# Patient Record
Sex: Female | Born: 1975 | Race: White | Hispanic: No | Marital: Married | Smoking: Former smoker
Health system: Southern US, Community
[De-identification: ages and names within clinical notes are randomized; demographics above are authoritative.]

## PROBLEM LIST (undated history)

## (undated) DIAGNOSIS — Z8619 Personal history of other infectious and parasitic diseases: Secondary | ICD-10-CM

## (undated) DIAGNOSIS — T7840XA Allergy, unspecified, initial encounter: Secondary | ICD-10-CM

## (undated) HISTORY — DX: Allergy, unspecified, initial encounter: T78.40XA

## (undated) HISTORY — PX: KNEE SURGERY: SHX244

## (undated) HISTORY — DX: Personal history of other infectious and parasitic diseases: Z86.19

---

## 2007-03-16 ENCOUNTER — Observation Stay: Payer: Self-pay | Admitting: Obstetrics and Gynecology

## 2007-03-18 ENCOUNTER — Inpatient Hospital Stay: Payer: Self-pay | Admitting: Obstetrics and Gynecology

## 2008-12-14 ENCOUNTER — Ambulatory Visit: Payer: Self-pay | Admitting: Orthopedic Surgery

## 2008-12-15 ENCOUNTER — Ambulatory Visit: Payer: Self-pay | Admitting: Orthopedic Surgery

## 2009-03-01 ENCOUNTER — Emergency Department: Payer: Self-pay | Admitting: Internal Medicine

## 2009-03-03 ENCOUNTER — Ambulatory Visit: Payer: Self-pay | Admitting: Unknown Physician Specialty

## 2009-10-26 DIAGNOSIS — J309 Allergic rhinitis, unspecified: Secondary | ICD-10-CM | POA: Insufficient documentation

## 2012-06-05 ENCOUNTER — Ambulatory Visit: Payer: Self-pay | Admitting: Obstetrics and Gynecology

## 2013-06-01 LAB — LIPID PANEL
CHOLESTEROL: 137 mg/dL (ref 0–200)
HDL: 72 mg/dL — AB (ref 35–70)
LDL CALC: 59 mg/dL
TRIGLYCERIDES: 29 mg/dL — AB (ref 40–160)

## 2013-06-01 LAB — BASIC METABOLIC PANEL
BUN: 17 mg/dL (ref 4–21)
Creatinine: 0.8 mg/dL (ref 0.5–1.1)
Glucose: 83 mg/dL
Potassium: 4.2 mmol/L (ref 3.4–5.3)
Sodium: 142 mmol/L (ref 137–147)

## 2013-06-01 LAB — HEPATIC FUNCTION PANEL
ALT: 7 U/L (ref 7–35)
AST: 18 U/L (ref 13–35)

## 2013-06-01 LAB — CBC AND DIFFERENTIAL
HEMATOCRIT: 44 % (ref 36–46)
Hemoglobin: 14.5 g/dL (ref 12.0–16.0)
PLATELETS: 221 10*3/uL (ref 150–399)
WBC: 4.6 10^3/mL

## 2013-06-01 LAB — TSH: TSH: 1.92 u[IU]/mL (ref 0.41–5.90)

## 2013-09-24 LAB — HM MAMMOGRAPHY

## 2014-05-25 LAB — HM PAP SMEAR: HM PAP: NEGATIVE

## 2015-11-18 ENCOUNTER — Ambulatory Visit (INDEPENDENT_AMBULATORY_CARE_PROVIDER_SITE_OTHER): Payer: PRIVATE HEALTH INSURANCE | Admitting: Obstetrics and Gynecology

## 2015-11-18 ENCOUNTER — Encounter: Payer: Self-pay | Admitting: Obstetrics and Gynecology

## 2015-11-18 ENCOUNTER — Other Ambulatory Visit: Payer: Self-pay | Admitting: Obstetrics and Gynecology

## 2015-11-18 VITALS — BP 111/73 | HR 61 | Ht 68.0 in | Wt 150.1 lb

## 2015-11-18 DIAGNOSIS — N921 Excessive and frequent menstruation with irregular cycle: Secondary | ICD-10-CM | POA: Diagnosis not present

## 2015-11-18 DIAGNOSIS — Z01419 Encounter for gynecological examination (general) (routine) without abnormal findings: Secondary | ICD-10-CM | POA: Diagnosis not present

## 2015-11-18 DIAGNOSIS — Z975 Presence of (intrauterine) contraceptive device: Secondary | ICD-10-CM

## 2015-11-18 NOTE — Patient Instructions (Signed)
  Place annual gynecologic exam patient instructions here.  Thank you for enrolling in MyChart. Please follow the instructions below to securely access your online medical record. MyChart allows you to send messages to your doctor, view your test results, manage appointments, and more.   How Do I Sign Up? 1. In your Internet browser, go to [employer] and enter [email or web address]. 2. Click on the Sign Up Now link in the Sign In box. You will see the New Member Sign Up page. 3. Enter your MyChart Access Code exactly as it appears below. You will not need to use this code after you've completed the sign-up process. If you do not sign up before the expiration date, you must request a new code.  MyChart Access Code: [email or web address] Expires: 12/10/2015  3:25 PM  4. Enter your Social Security Number ([id number]) and Date of Birth (mm/dd/yyyy) as indicated and click Submit. You will be taken to the next sign-up page. 5. Create a MyChart ID. This will be your MyChart login ID and cannot be changed, so think of one that is secure and easy to remember. 6. Create a MyChart password. You can change your password at any time. 7. Enter your Password Reset Question and Answer. This can be used at a later time if you forget your password.  8. Enter your e-mail address. You will receive e-mail notification when new information is available in MyChart. 9. Click Sign Up. You can now view your medical record.   Additional Information Remember, MyChart is NOT to be used for urgent needs. For medical emergencies, dial 911.

## 2015-11-18 NOTE — Progress Notes (Signed)
Subjective:   Victoria Leblanc is a 40 y.o. G81P2002 Caucasian female here for a routine well-woman exam.  No LMP recorded (lmp unknown). Patient is not currently having periods (Reason: IUD).    Current complaints: painful cramping with irregular bleeding since Mirena replaced 3 years ago, reports it was a difficult placement with a lot of pain and bleeding, and hasn't felt right since. Denies pain with sex or postcoital bleeding. 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  Past Medical History History reviewed. No pertinent past medical history.  Past Surgical History Past Surgical History  Procedure Laterality Date  . Knee surgery      x 6    Gynecologic History G2P2002  No LMP recorded (lmp unknown). Patient is not currently having periods (Reason: IUD). Contraception: IUD Last Pap: 2015. Results were: normal   Obstetric History OB History  Gravida Para Term Preterm AB SAB TAB Ectopic Multiple Living  # Outcome Date GA Lbr Len/2nd Weight Sex Delivery Anes PTL Lv  2 Term 2008   7 lb 1.9 oz (3.23 kg) F Vag-Spont   Y  1 Term 2005   7 lb 2.4 oz (3.243 kg) M Vag-Spont   Y      Current Medications No current outpatient prescriptions on file prior to visit.   No current facility-administered medications on file prior to visit.    Review of Systems Patient denies any headaches, blurred vision, shortness of breath, chest pain, abdominal pain, problems with bowel movements, urination, or intercourse.  Objective:  BP 111/73 mmHg  Pulse  61  Ht  (1.727 m)  Wt 150 lb 1.6 oz (68.085 kg)  BMI 22.83 kg/m2  LMP  (LMP Unknown) Physical Exam  General:  Well developed, well nourished, no acute distress. She is alert and oriented x3. Skin:  Warm and dry Neck:  Midline trachea, no thyromegaly or nodules Cardiovascular: Regular rate and rhythm, no murmur heard Lungs:  Effort normal, all lung fields clear to auscultation bilaterally Breasts:  No dominant palpable mass, retraction, or nipple discharge Abdomen:  Soft, non tender, no hepatosplenomegaly or masses Pelvic:  External genitalia is normal in appearance.  The vagina is normal in appearance. The cervix is bulbous, no CMT.  Thin prep pap is done with HR HPV cotesting. Uterus is felt to be normal size, shape, and contour.  No adnexal masses or tenderness noted. IUD string noted Extremities:  No swelling or varicosities noted Psych:  She has a normal mood and affect  Assessment:   Healthy well-woman exam BTB with IUD  pelvic pain  Plan:  Routine screening labs obtained Will return in 1-2 weeks for pelvic u/s and follow up accordingly F/U 1 year for AE, or sooner if needed   Melody Suzan Nailer, CNM

## 2015-11-19 LAB — LIPID PANEL
Chol/HDL Ratio: 2.1 ratio units (ref 0.0–4.4)
Cholesterol, Total: 136 mg/dL (ref 100–199)
HDL: 65 mg/dL (ref >39)
LDL Calculated: 63 mg/dL (ref 0–99)
Triglycerides: 40 mg/dL (ref 0–149)
VLDL Cholesterol Cal: 8 mg/dL (ref 5–40)

## 2015-11-19 LAB — COMPREHENSIVE METABOLIC PANEL
ALBUMIN: 4.6 g/dL (ref 3.5–5.5)
ALT: 5 IU/L (ref 0–32)
AST: 14 IU/L (ref 0–40)
Albumin/Globulin Ratio: 2.3 (ref 1.1–2.5)
Alkaline Phosphatase: 30 IU/L — ABNORMAL LOW (ref 39–117)
BILIRUBIN TOTAL: 0.4 mg/dL (ref 0.0–1.2)
BUN / CREAT RATIO: 18 (ref 8–20)
BUN: 14 mg/dL (ref 6–20)
CO2: 22 mmol/L (ref 18–29)
CREATININE: 0.76 mg/dL (ref 0.57–1.00)
Calcium: 9.3 mg/dL (ref 8.7–10.2)
Chloride: 103 mmol/L (ref 96–106)
GFR calc non Af Amer: 99 mL/min/{1.73_m2} (ref >59)
GFR, EST AFRICAN AMERICAN: 114 mL/min/{1.73_m2} (ref >59)
GLUCOSE: 86 mg/dL (ref 65–99)
Globulin, Total: 2 g/dL (ref 1.5–4.5)
Potassium: 3.6 mmol/L (ref 3.5–5.2)
Sodium: 141 mmol/L (ref 134–144)
TOTAL PROTEIN: 6.6 g/dL (ref 6.0–8.5)

## 2015-11-21 LAB — CYTOLOGY - PAP

## 2015-11-22 ENCOUNTER — Telehealth: Payer: Self-pay | Admitting: *Deleted

## 2015-11-22 NOTE — Telephone Encounter (Signed)
Mailed all info to pt 

## 2015-11-22 NOTE — Telephone Encounter (Signed)
-----   Message from Purcell Nails, [place] sent at 11/22/2015  2:29 PM EST ----- Please let her know pap was ASCUS, but negative HPV, so we will just repeat in 1 year. Please mail info on her pap

## 2015-12-02 ENCOUNTER — Other Ambulatory Visit: Payer: Self-pay | Admitting: Obstetrics and Gynecology

## 2015-12-02 DIAGNOSIS — R102 Pelvic and perineal pain: Secondary | ICD-10-CM

## 2015-12-09 ENCOUNTER — Ambulatory Visit: Payer: PRIVATE HEALTH INSURANCE | Admitting: Obstetrics and Gynecology

## 2015-12-09 ENCOUNTER — Encounter: Payer: Self-pay | Admitting: Obstetrics and Gynecology

## 2015-12-09 ENCOUNTER — Ambulatory Visit (INDEPENDENT_AMBULATORY_CARE_PROVIDER_SITE_OTHER): Payer: PRIVATE HEALTH INSURANCE

## 2015-12-09 ENCOUNTER — Ambulatory Visit (INDEPENDENT_AMBULATORY_CARE_PROVIDER_SITE_OTHER): Payer: PRIVATE HEALTH INSURANCE | Admitting: Obstetrics and Gynecology

## 2015-12-09 VITALS — BP 117/66 | HR 63 | Wt 151.7 lb

## 2015-12-09 DIAGNOSIS — R102 Pelvic and perineal pain unspecified side: Secondary | ICD-10-CM

## 2015-12-09 DIAGNOSIS — Z30432 Encounter for removal of intrauterine contraceptive device: Secondary | ICD-10-CM

## 2015-12-09 MED ORDER — NORETHIN-ETH ESTRAD-FE BIPHAS 1 MG-10 MCG / 10 MCG PO TABS: 1.0000 | ORAL_TABLET | Freq: Every day | ORAL | Status: DC

## 2015-12-09 NOTE — Progress Notes (Signed)
Patient ID: Victoria RanksLynette Leblanc, female   DOB: 02/17/1976, 40 y.o.   MRN: [id number] S: still having intermittent pelvic pain with IUD- here to review u/s and desires removal of IUD. Spouse is planning on getting vasectomy.  O: A&O x4 Well groomed female Blood pressure 117/66, pulse 63, weight 151 lb 11.2 oz (68.811 kg).   U/S Findings:  The uterus measures 9.3 x 4.5 x 5.8 cm. An IUD is seen and on 3D imaging, appears to be in the proper place within the uterus.  Echo texture is homogenous without evidence of focal masses.  The Endometrium measures 3.4 mm.  Right Ovary measures 3.0 x 1.7 x 1.9 cm. There is a dominant 1.3 cm follicle seen, otherwise, the ovary appears WNL. Left Ovary measures 3.6 x 2.1 x 2.1 cm. There is a dominant 1.7 cm follicle seen, otherwise, the ovary appears WNL. Survey of the adnexa demonstrates no adnexal masses. There is no free fluid in the cul de sac.  Impression: 1. Normal appearing uterus with IUD which appears within the proper place within. 2. Dominant bilateral ovarian follicles.     IUD removed without difficulty, and 2 samples of LoLoEstrin given- to start today RTC prn.  Melody [place]townShambley, CNM

## 2016-01-31 ENCOUNTER — Telehealth: Payer: Self-pay | Admitting: *Deleted

## 2016-01-31 ENCOUNTER — Other Ambulatory Visit: Payer: Self-pay | Admitting: *Deleted

## 2016-01-31 MED ORDER — NORETHIN-ETH ESTRAD-FE BIPHAS 1 MG-10 MCG / 10 MCG PO TABS: 1.0000 | ORAL_TABLET | Freq: Every day | ORAL | Status: DC

## 2016-01-31 NOTE — Telephone Encounter (Signed)
Sent in medication

## 2016-01-31 NOTE — Telephone Encounter (Signed)
Patient states she needs a refill of her birth control Loestrin. [facility] [address]. Thanks

## 2016-03-07 ENCOUNTER — Ambulatory Visit (INDEPENDENT_AMBULATORY_CARE_PROVIDER_SITE_OTHER): Payer: PRIVATE HEALTH INSURANCE | Admitting: Family Medicine

## 2016-03-07 ENCOUNTER — Encounter: Payer: Self-pay | Admitting: Family Medicine

## 2016-03-07 VITALS — BP 110/60 | HR 67 | Temp 98.3°F | Resp 16 | Wt 154.0 lb

## 2016-03-07 DIAGNOSIS — H538 Other visual disturbances: Secondary | ICD-10-CM | POA: Diagnosis not present

## 2016-03-07 DIAGNOSIS — R51 Headache: Secondary | ICD-10-CM | POA: Diagnosis not present

## 2016-03-07 DIAGNOSIS — I499 Cardiac arrhythmia, unspecified: Secondary | ICD-10-CM | POA: Insufficient documentation

## 2016-03-07 DIAGNOSIS — T753XXA Motion sickness, initial encounter: Secondary | ICD-10-CM | POA: Insufficient documentation

## 2016-03-07 DIAGNOSIS — R519 Headache, unspecified: Secondary | ICD-10-CM

## 2016-03-07 NOTE — Progress Notes (Signed)
       Patient: Victoria Leblanc Female    DOB: 01/18/1976   40 y.o.   MRN: [id number] Visit Date: 03/07/2016  Today's Provider: Mila Merryonald Fisher, MD   Chief Complaint  Patient presents with  . Eye Problem   Subjective:    HPI  Patient has had 2 episodes in the last month that her vision suddenly became blurry, starting with peripheral vision and working in word. Blurry vision will last up to 15 minutes then return to normal.  Afterwords she will develop a "very dull" headache across both temples that may last several hours. She feels a little nauseated when he vision is blurred, but no vomiting and no other neurological symptoms. The second episode was early this morning and has completely resolved. Otherwise she feels well. She admits to feeling tired most of the time which she attributes to her very busy schedule working, caring for two children with her husband working out of town. There has been no recent change in energy level. She is not taking any OTC medications besides Advil today. She denies history of migraines, but states she did have two severe headaches years ago causing nausea and vomiting but no other neurological symptoms.   Allergies  Allergen Reactions  . Morphine     swelling, hives.  . Pollen Extract     itchy eyes.   Current Meds  Medication Sig  . Norethindrone-Ethinyl Estradiol-Fe Biphas (LO LOESTRIN FE) 1 MG-10 MCG / 10 MCG tablet Take 1 tablet by mouth daily.    Review of Systems  Constitutional: Negative for fever, chills, appetite change and fatigue.  Eyes: Positive for photophobia and visual disturbance.  Respiratory: Negative for chest tightness and shortness of breath.   Cardiovascular: Negative for chest pain and palpitations.  Gastrointestinal: Negative for nausea, vomiting and abdominal pain.  Neurological: Positive for headaches. Negative for dizziness and weakness.    Social History  Substance Use Topics  . Smoking status: Former Games developermoker  .  Smokeless tobacco: Not on file  . Alcohol Use: 0.0 oz/week    0 Standard drinks or equivalent per week     Comment: occas   Objective:   BP 110/60 mmHg  Pulse 67  Temp(Src) 98.3 F (36.8 C) (Oral)  Resp 16  Wt 154 lb (69.854 kg)  SpO2 98%  Physical Exam   General Appearance:    Alert, cooperative, no distress  HEENT:    NP/OP pink and clear without lesions.   Eyes:    PERRL, conjunctiva/corneas clear, EOM's intact       Lungs:     Clear to auscultation bilaterally, respirations unlabored  Heart:    Regular rate and rhythm. No MRG  Neurologic:   Awake, alert, oriented x 3. No apparent focal neurological           defect. Normal s/s. Normal finger to nose. Negative rhomberg.           Assessment & Plan:     1. Blurry vision, bilateral  - Ambulatory referral to Ophthalmology  2. Nonintractable episodic headache, unspecified headache type Ddx includes migraine, intraocular disease, and intracranial disease. She has never had neuroimaging. If ophthalmology evaluation is negative will consider neuroimaging.        Mila Merryonald Fisher, MD  [facility]

## 2016-05-24 ENCOUNTER — Encounter: Payer: Self-pay | Admitting: Obstetrics and Gynecology

## 2016-05-24 ENCOUNTER — Ambulatory Visit (INDEPENDENT_AMBULATORY_CARE_PROVIDER_SITE_OTHER): Payer: PRIVATE HEALTH INSURANCE | Admitting: Obstetrics and Gynecology

## 2016-05-24 VITALS — BP 135/78 | HR 73 | Ht 68.0 in | Wt 154.5 lb

## 2016-05-24 DIAGNOSIS — Z30430 Encounter for insertion of intrauterine contraceptive device: Secondary | ICD-10-CM | POA: Diagnosis not present

## 2016-05-24 NOTE — Progress Notes (Signed)
Patrici RanksLynette Leblanc is a 40 y.o. year old 582P2002 Caucasian female who presents for placement of a Mirena IUD.  Patient's last menstrual period was 04/15/2016. BP 135/78   Pulse 73   Ht 5\' 8"  (1.727 m)   Wt 154 lb 8 oz (70.1 kg)   LMP 04/15/2016   BMI 23.49 kg/m  Last sexual intercourse was >3 weeks ago and is on OCPs, and pregnancy test today was negative  The risks and benefits of the method and placement have been thouroughly reviewed with the patient and all questions were answered.  Specifically the patient is aware of failure rate of 09/998, expulsion of the IUD and of possible perforation.  The patient is aware of irregular bleeding due to the method and understands the incidence of irregular bleeding diminishes with time.  Signed copy of informed consent in chart.   Time out was performed.  A graves speculum was placed in the vagina.  The cervix was visualized, prepped using Betadine, and grasped with a single tooth tenaculum. The uterus was found to be retroflexed and it sounded to 9 cm.  Mirena IUD placed per manufacturer's recommendations.   The strings were trimmed to 3 cm.  The patient was given post procedure instructions, including signs and symptoms of infection and to check for the strings after each menses or each month, and refraining from intercourse or anything in the vagina for 3 days.  She was given a Mirena care card with date Mirena placed, and date Mirena to be removed.    Victoria Leblanc Suzan NailerN Jaden Batchelder, CNM

## 2016-05-24 NOTE — Patient Instructions (Signed)
IUD PLACEMENT POST-PROCEDURE INSTRUCTIONS  1. You may take Ibuprofen, Aleve or Tylenol for pain if needed.  Cramping should resolve within in 24 hours.  2. You may have a small amount of spotting.  You should wear a mini pad for the next few days.  3. You may have intercourse after 24 hours.  If you using this for birth control, it is effective immediately.  4. You need to call if you have any pelvic pain, fever, heavy bleeding or foul smelling vaginal discharge.  Irregular bleeding is common the first several months after having an IUD placed. You do not need to call for this reason unless you are concerned.  5. Shower or bathe as normal   

## 2017-01-08 ENCOUNTER — Other Ambulatory Visit: Payer: Self-pay | Admitting: Sports Medicine

## 2017-01-08 DIAGNOSIS — M25561 Pain in right knee: Secondary | ICD-10-CM

## 2017-01-17 ENCOUNTER — Ambulatory Visit
Admission: RE | Admit: 2017-01-17 | Discharge: 2017-01-17 | Disposition: A | Discharge status: Home / Self Care | Payer: PRIVATE HEALTH INSURANCE | Source: Ambulatory Visit | Attending: Sports Medicine | Admitting: Sports Medicine

## 2017-01-17 ENCOUNTER — Encounter: Payer: Self-pay | Admitting: Radiology

## 2017-01-17 DIAGNOSIS — S83501A Sprain of unspecified cruciate ligament of right knee, initial encounter: Secondary | ICD-10-CM | POA: Insufficient documentation

## 2017-01-17 DIAGNOSIS — M1711 Unilateral primary osteoarthritis, right knee: Secondary | ICD-10-CM | POA: Diagnosis not present

## 2017-01-17 DIAGNOSIS — X58XXXA Exposure to other specified factors, initial encounter: Secondary | ICD-10-CM | POA: Insufficient documentation

## 2017-01-17 DIAGNOSIS — M25461 Effusion, right knee: Secondary | ICD-10-CM | POA: Diagnosis not present

## 2017-01-17 DIAGNOSIS — M25561 Pain in right knee: Secondary | ICD-10-CM | POA: Diagnosis not present

## 2017-12-17 ENCOUNTER — Other Ambulatory Visit: Payer: Self-pay | Admitting: *Deleted

## 2017-12-17 ENCOUNTER — Telehealth: Payer: Self-pay | Admitting: Obstetrics and Gynecology

## 2017-12-17 MED ORDER — SCOPOLAMINE 1 MG/3DAYS TD PT72: 1.0000 | MEDICATED_PATCH | TRANSDERMAL | 1 refills | Status: AC

## 2017-12-17 NOTE — Telephone Encounter (Signed)
[place] and [place] motion sickness. She wanted to know if she could get something for that called in or if she needs to come in for an appointment she can do that. Thanks

## 2017-12-17 NOTE — Telephone Encounter (Signed)
Done-ac 

## 2018-03-21 ENCOUNTER — Encounter: Payer: PRIVATE HEALTH INSURANCE | Admitting: Obstetrics and Gynecology

## 2018-04-10 ENCOUNTER — Ambulatory Visit (INDEPENDENT_AMBULATORY_CARE_PROVIDER_SITE_OTHER): Payer: PRIVATE HEALTH INSURANCE | Admitting: Obstetrics and Gynecology

## 2018-04-10 ENCOUNTER — Other Ambulatory Visit: Payer: Self-pay | Admitting: Obstetrics and Gynecology

## 2018-04-10 ENCOUNTER — Encounter: Payer: Self-pay | Admitting: Obstetrics and Gynecology

## 2018-04-10 VITALS — BP 118/62 | HR 88 | Ht 68.0 in | Wt 137.0 lb

## 2018-04-10 DIAGNOSIS — Z01419 Encounter for gynecological examination (general) (routine) without abnormal findings: Secondary | ICD-10-CM

## 2018-04-10 DIAGNOSIS — Z30431 Encounter for routine checking of intrauterine contraceptive device: Secondary | ICD-10-CM | POA: Diagnosis not present

## 2018-04-10 NOTE — Progress Notes (Signed)
Subjective:   Victoria Leblanc is a 42 y.o. G40P2002 Caucasian female here for a routine well-woman exam.  Patient's last menstrual period was 03/21/2018.    Current complaints: Pt present with no gynecological or medical complaints today. 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  Past Medical History Past Medical History:  Diagnosis Date  . Allergy   . History of chicken pox     Past Surgical History Past Surgical History:  Procedure Laterality Date  . KNEE SURGERY Bilateral    x 6; Dr. Rosita Kea    Gynecologic History [id number]  Patient's last menstrual period was 03/21/2018. Contraception: IUD Last Pap: 11/18/2015. Results were: normal Last mammogram: 11/18/2015. Results were: normal   Obstetric History OB History  Gravida Para Term Preterm AB Living  2 2 2     2   SAB TAB Ectopic Multiple Live Births          2    # Outcome Date GA Lbr Len/2nd Weight Sex Delivery Anes PTL Lv  2 Term 2008   7 lb 1.9 oz (3.23 kg) F Vag-Spont   LIV  1 Term 2005   7 lb 2.4 oz (3.243 kg) M Vag-Spont   LIV    Current Medications Current Outpatient Medications on File Prior to Visit  Medication Sig Dispense Refill  . levonorgestrel (MIRENA) 20 MCG/24HR IUD 1 each by Intrauterine route once.    . Norethindrone-Ethinyl Estradiol-Fe Biphas (LO LOESTRIN FE) 1 MG-10 MCG / 10 MCG tablet Take 1 tablet by mouth daily. (Patient not taking: Reported on 04/10/2018) 3 Package 3  . scopolamine (TRANSDERM-SCOP, 1.5  MG,) 1 MG/3DAYS Place 1 patch (1.5 mg total) onto the skin every 3 (three) days. (Patient not taking: Reported on 04/10/2018) 10 patch 1   No current facility-administered medications on file prior to visit.     Review of Systems Patient denies any headaches, blurred vision, shortness of breath, chest pain, abdominal pain, problems with bowel movements, urination, or intercourse.  Objective:  BP 118/62   Pulse 88   Ht 5\' 8"  (1.727 m)   Wt 137 lb (62.1 kg)   LMP 03/21/2018   BMI 20.83 kg/m  Physical Exam  General:  Well developed, well nourished, no acute distress. She is alert and oriented x3. Skin:  Warm and dry Neck:  Midline trachea, no thyromegaly or nodules Cardiovascular: Regular rate and rhythm, no murmur heard Lungs:  Effort normal, all lung fields clear to auscultation bilaterally Breasts:  No dominant palpable mass, retraction, or nipple discharge Abdomen:  Soft, non tender, no hepatosplenomegaly or masses Pelvic:  External genitalia is normal in appearance.  The vagina is normal in appearance. The cervix is bulbous, no CMT.  Thin prep pap is done with HR HPV cotesting. IUD string visualized. Uterus is felt to be normal size, shape, and contour.  No adnexal masses or tenderness noted. Extremities:  No swelling or varicosities noted Psych:  She has a normal mood and affect  Assessment:   Healthy well-woman exam IUD  Plan:   F/U 1 year for annual exam, or  sooner if needed Mammogram to be scheduled Labs to be drawn today and follow up as necessary   Melody Suzan NailerN Shambley, CNM

## 2018-04-10 NOTE — Patient Instructions (Signed)
Preventive Care 18-39 Years, Female Preventive care refers to lifestyle choices and visits with your health care provider that can promote health and wellness. What does preventive care include?  A yearly physical exam. This is also called an annual well check.  Dental exams once or twice a year.  Routine eye exams. Ask your health care provider how often you should have your eyes checked.  Personal lifestyle choices, including: ? Daily care of your teeth and gums. ? Regular physical activity. ? Eating a healthy diet. ? Avoiding tobacco and drug use. ? Limiting alcohol use. ? Practicing safe sex. ? Taking vitamin and mineral supplements as recommended by your health care provider. What happens during an annual well check? The services and screenings done by your health care provider during your annual well check will depend on your age, overall health, lifestyle risk factors, and family history of disease. Counseling Your health care provider may ask you questions about your:  Alcohol use.  Tobacco use.  Drug use.  Emotional well-being.  Home and relationship well-being.  Sexual activity.  Eating habits.  Work and work Statistician.  Method of birth control.  Menstrual cycle.  Pregnancy history.  Screening You may have the following tests or measurements:  Height, weight, and BMI.  Diabetes screening. This is done by checking your blood sugar (glucose) after you have not eaten for a while (fasting).  Blood pressure.  Lipid and cholesterol levels. These may be checked every 5 years starting at age 38.  Skin check.  Hepatitis C blood test.  Hepatitis B blood test.  Sexually transmitted disease (STD) testing.  BRCA-related cancer screening. This may be done if you have a family history of breast, ovarian, tubal, or peritoneal cancers.  Pelvic exam and Pap test. This may be done every 3 years starting at age 38. Starting at age 30, this may be done  every 5 years if you have a Pap test in combination with an HPV test.  Discuss your test results, treatment options, and if necessary, the need for more tests with your health care provider. Vaccines Your health care provider may recommend certain vaccines, such as:  Influenza vaccine. This is recommended every year.  Tetanus, diphtheria, and acellular pertussis (Tdap, Td) vaccine. You may need a Td booster every 10 years.  Varicella vaccine. You may need this if you have not been vaccinated.  HPV vaccine. If you are 39 or younger, you may need three doses over 6 months.  Measles, mumps, and rubella (MMR) vaccine. You may need at least one dose of MMR. You may also need a second dose.  Pneumococcal 13-valent conjugate (PCV13) vaccine. You may need this if you have certain conditions and were not previously vaccinated.  Pneumococcal polysaccharide (PPSV23) vaccine. You may need one or two doses if you smoke cigarettes or if you have certain conditions.  Meningococcal vaccine. One dose is recommended if you are age 68-21 years and a first-year college student living in a residence hall, or if you have one of several medical conditions. You may also need additional booster doses.  Hepatitis A vaccine. You may need this if you have certain conditions or if you travel or work in places where you may be exposed to hepatitis A.  Hepatitis B vaccine. You may need this if you have certain conditions or if you travel or work in places where you may be exposed to hepatitis B.  Haemophilus influenzae type b (Hib) vaccine. You may need this  if you have certain risk factors.  Talk to your health care provider about which screenings and vaccines you need and how often you need them. This information is not intended to replace advice given to you by your health care provider. Make sure you discuss any questions you have with your health care provider. Document Released: 11/06/2001 Document Revised:  05/30/2016 Document Reviewed: 07/12/2015 Elsevier Interactive Patient Education  2018 [employer]vier Inc.  

## 2018-04-11 LAB — COMPREHENSIVE METABOLIC PANEL
ALK PHOS: 28 IU/L — AB (ref 39–117)
ALT: 8 IU/L (ref 0–32)
AST: 15 IU/L (ref 0–40)
Albumin/Globulin Ratio: 1.9 (ref 1.2–2.2)
Albumin: 4.1 g/dL (ref 3.5–5.5)
BILIRUBIN TOTAL: 0.3 mg/dL (ref 0.0–1.2)
BUN / CREAT RATIO: 16 (ref 9–23)
BUN: 13 mg/dL (ref 6–24)
CHLORIDE: 103 mmol/L (ref 96–106)
CO2: 25 mmol/L (ref 20–29)
Calcium: 8.8 mg/dL (ref 8.7–10.2)
Creatinine, Ser: 0.8 mg/dL (ref 0.57–1.00)
GFR calc non Af Amer: 91 mL/min/{1.73_m2} (ref >59)
GFR, EST AFRICAN AMERICAN: 105 mL/min/{1.73_m2} (ref >59)
GLUCOSE: 83 mg/dL (ref 65–99)
Globulin, Total: 2.2 g/dL (ref 1.5–4.5)
Potassium: 4.3 mmol/L (ref 3.5–5.2)
Sodium: 141 mmol/L (ref 134–144)
TOTAL PROTEIN: 6.3 g/dL (ref 6.0–8.5)

## 2018-04-11 LAB — LIPID PANEL
Chol/HDL Ratio: 2 ratio (ref 0.0–4.4)
Cholesterol, Total: 142 mg/dL (ref 100–199)
HDL: 70 mg/dL (ref >39)
LDL Calculated: 65 mg/dL (ref 0–99)
Triglycerides: 33 mg/dL (ref 0–149)
VLDL CHOLESTEROL CAL: 7 mg/dL (ref 5–40)

## 2018-04-15 LAB — CYTOLOGY - PAP

## 2018-04-30 ENCOUNTER — Ambulatory Visit
Admission: RE | Admit: 2018-04-30 | Discharge: 2018-04-30 | Disposition: A | Discharge status: Home / Self Care | Payer: PRIVATE HEALTH INSURANCE | Source: Ambulatory Visit | Attending: Obstetrics and Gynecology | Admitting: Obstetrics and Gynecology

## 2018-04-30 DIAGNOSIS — Z01419 Encounter for gynecological examination (general) (routine) without abnormal findings: Secondary | ICD-10-CM | POA: Insufficient documentation

## 2018-05-01 ENCOUNTER — Other Ambulatory Visit: Payer: Self-pay | Admitting: Obstetrics and Gynecology

## 2018-05-01 DIAGNOSIS — R928 Other abnormal and inconclusive findings on diagnostic imaging of breast: Secondary | ICD-10-CM

## 2018-05-01 DIAGNOSIS — R921 Mammographic calcification found on diagnostic imaging of breast: Secondary | ICD-10-CM

## 2018-05-07 ENCOUNTER — Ambulatory Visit
Admission: RE | Admit: 2018-05-07 | Discharge: 2018-05-07 | Disposition: A | Discharge status: Home / Self Care | Payer: PRIVATE HEALTH INSURANCE | Source: Ambulatory Visit | Attending: Obstetrics and Gynecology | Admitting: Obstetrics and Gynecology

## 2018-05-07 DIAGNOSIS — R921 Mammographic calcification found on diagnostic imaging of breast: Secondary | ICD-10-CM

## 2018-05-07 DIAGNOSIS — R928 Other abnormal and inconclusive findings on diagnostic imaging of breast: Secondary | ICD-10-CM | POA: Diagnosis not present

## 2018-09-26 ENCOUNTER — Other Ambulatory Visit: Payer: Self-pay | Admitting: Obstetrics and Gynecology

## 2018-09-26 DIAGNOSIS — R921 Mammographic calcification found on diagnostic imaging of breast: Secondary | ICD-10-CM

## 2018-11-11 ENCOUNTER — Ambulatory Visit
Admission: RE | Admit: 2018-11-11 | Discharge: 2018-11-11 | Disposition: A | Discharge status: Home / Self Care | Payer: PRIVATE HEALTH INSURANCE | Source: Ambulatory Visit | Attending: Obstetrics and Gynecology | Admitting: Obstetrics and Gynecology

## 2018-11-11 DIAGNOSIS — R921 Mammographic calcification found on diagnostic imaging of breast: Secondary | ICD-10-CM

## 2018-11-12 ENCOUNTER — Other Ambulatory Visit: Payer: Self-pay | Admitting: Obstetrics and Gynecology

## 2018-11-12 DIAGNOSIS — Z1231 Encounter for screening mammogram for malignant neoplasm of breast: Secondary | ICD-10-CM

## 2018-11-12 DIAGNOSIS — R921 Mammographic calcification found on diagnostic imaging of breast: Secondary | ICD-10-CM

## 2019-05-04 ENCOUNTER — Ambulatory Visit
Admission: RE | Admit: 2019-05-04 | Discharge: 2019-05-04 | Disposition: A | Discharge status: Home / Self Care | Payer: PRIVATE HEALTH INSURANCE | Source: Ambulatory Visit | Attending: Obstetrics and Gynecology | Admitting: Obstetrics and Gynecology

## 2019-05-04 ENCOUNTER — Other Ambulatory Visit: Payer: Self-pay

## 2019-05-04 DIAGNOSIS — R921 Mammographic calcification found on diagnostic imaging of breast: Secondary | ICD-10-CM | POA: Insufficient documentation

## 2019-05-04 DIAGNOSIS — Z1231 Encounter for screening mammogram for malignant neoplasm of breast: Secondary | ICD-10-CM | POA: Insufficient documentation

## 2019-05-06 ENCOUNTER — Other Ambulatory Visit: Payer: PRIVATE HEALTH INSURANCE

## 2021-03-21 ENCOUNTER — Ambulatory Visit (INDEPENDENT_AMBULATORY_CARE_PROVIDER_SITE_OTHER): Payer: 59 | Admitting: Certified Nurse Midwife

## 2021-03-21 ENCOUNTER — Encounter: Payer: Self-pay | Admitting: Certified Nurse Midwife

## 2021-03-21 ENCOUNTER — Other Ambulatory Visit: Payer: Self-pay

## 2021-03-21 VITALS — BP 118/77 | HR 66 | Ht 68.0 in | Wt 140.2 lb

## 2021-03-21 DIAGNOSIS — Z1231 Encounter for screening mammogram for malignant neoplasm of breast: Secondary | ICD-10-CM

## 2021-03-21 DIAGNOSIS — Z1159 Encounter for screening for other viral diseases: Secondary | ICD-10-CM

## 2021-03-21 DIAGNOSIS — Z01419 Encounter for gynecological examination (general) (routine) without abnormal findings: Secondary | ICD-10-CM | POA: Diagnosis not present

## 2021-03-21 DIAGNOSIS — Z114 Encounter for screening for human immunodeficiency virus [HIV]: Secondary | ICD-10-CM

## 2021-03-21 DIAGNOSIS — Z23 Encounter for immunization: Secondary | ICD-10-CM

## 2021-03-21 MED ORDER — TETANUS-DIPHTH-ACELL PERTUSSIS 5-2.5-18.5 LF-MCG/0.5 IM SUSY
0.5000 mL | PREFILLED_SYRINGE | Freq: Once | INTRAMUSCULAR | Status: AC
  Administered 2021-03-21: 14:00:00 0.5 mL via INTRAMUSCULAR

## 2021-03-21 NOTE — Addendum Note (Signed)
Addended by: Mechele Claude on: 03/21/2021 02:08 PM   Modules accepted: Orders

## 2021-03-21 NOTE — Progress Notes (Addendum)
GYNECOLOGY ANNUAL PREVENTATIVE CARE ENCOUNTER NOTE  History:     Victoria Leblanc is a 45 y.o. G4P2002 female here for a routine annual gynecologic exam.  Current complaints: none.   Denies abnormal vaginal bleeding, discharge, pelvic pain, problems with intercourse or other gynecologic concerns.     Social Relationship: married  Living: spouse and children Work: [employer] school system  Exercise: walks daily  Smoke/Alcohol/drug use: rare alcohol, denies smoking and drug use  Gynecologic History No LMP recorded. (Menstrual status: IUD). Contraception: IUD Last Pap: 04/10/2018. Results were: normal with negative HPV Last mammogram: 05/04/19. Results were: No suspicious mass or distortion identified in either breast. Two coarse calcifications in the upper-outer quadrant of the left breast are stable compared to the prior exam dated 04/30/2018  The pregnancy intention screening data noted above was reviewed. Potential methods of contraception were discussed. The patient elected to proceed with IUD or IUS.    Obstetric History OB History  Gravida Para Term Preterm AB Living  2 2 2     2   SAB IAB Ectopic Multiple Live Births          2    # Outcome Date GA Lbr Len/2nd Weight Sex Delivery Anes PTL Lv  2 Term 2008   7 lb 1.9 oz (3.23 kg) F Vag-Spont   LIV  1 Term 2005   7 lb 2.4 oz (3.243 kg) M Vag-Spont   LIV    Past Medical History:  Diagnosis Date   Allergy    History of chicken pox     Past Surgical History:  Procedure Laterality Date   KNEE SURGERY Bilateral    x 6; Dr. 2009    Current Outpatient Medications on File Prior to Visit  Medication Sig Dispense Refill   levonorgestrel (MIRENA) 20 MCG/24HR IUD 1 each by Intrauterine route once.     Norethindrone-Ethinyl Estradiol-Fe Biphas (LO LOESTRIN FE) 1 MG-10 MCG / 10 MCG tablet Take 1 tablet by mouth daily. (Patient not taking: No sig reported) 3 Package 3   scopolamine (TRANSDERM-SCOP, 1.5 MG,) 1 MG/3DAYS Place 1  patch (1.5 mg total) onto the skin every 3 (three) days. (Patient not taking: No sig reported) 10 patch 1   No current facility-administered medications on file prior to visit.    Allergies  Allergen Reactions   Morphine     swelling, hives.   Pollen Extract     itchy eyes.    Social History:  reports that she has quit smoking. She has never used smokeless tobacco. She reports current alcohol use. She reports that she does not use drugs.  Family History  Problem Relation Age of Onset   Diabetes Father        type 2   Hypertension Father    Skin cancer Father    Osteoporosis Mother    Hypertension Mother    Hypothyroidism Mother    Cancer Neg Hx    Heart disease Neg Hx    Breast cancer Neg Hx     The following portions of the patient's history were reviewed and updated as appropriate: allergies, current medications, past family history, past medical history, past social history, past surgical history and problem list.  Review of Systems Pertinent items noted in HPI and remainder of comprehensive ROS otherwise negative.  Physical Exam:  BP 118/77   Pulse 66   Ht 5\' 8"  (1.727 m)   Wt 140 lb 3.2 oz (63.6 kg)   BMI 21.32 kg/m  CONSTITUTIONAL:  Well-developed, well-nourished female in no acute distress.  HENT:  Normocephalic, atraumatic, External right and left ear normal. Oropharynx is clear and moist EYES: Conjunctivae and EOM are normal. Pupils are equal, round, and reactive to light. No scleral icterus.  NECK: Normal range of motion, supple, no masses.  Normal thyroid.  SKIN: Skin is warm and dry. No rash noted. Not diaphoretic. No erythema. No pallor. MUSCULOSKELETAL: Normal range of motion. No tenderness.  No cyanosis, clubbing, or edema.  2+ distal pulses. NEUROLOGIC: Alert and oriented to person, place, and time. Normal reflexes, muscle tone coordination.  PSYCHIATRIC: Normal mood and affect. Normal behavior. Normal judgment and thought content. CARDIOVASCULAR:  Normal heart rate noted, regular rhythm RESPIRATORY: Clear to auscultation bilaterally. Effort and breath sounds normal, no problems with respiration noted. BREASTS: Symmetric in size. No masses, tenderness, skin changes, nipple drainage, or lymphadenopathy bilaterally.  ABDOMEN: Soft, no distention noted.  No tenderness, rebound or guarding.  PELVIC: Normal appearing external genitalia and urethral meatus; normal appearing vaginal mucosa and cervix.  No abnormal discharge noted.  Pap smear obtained.  Normal uterine size, no other palpable masses, no uterine or adnexal tenderness.  .   Assessment and Plan:    1. Well woman exam with routine gynecological exam  - MM 3D SCREEN BREAST BILATERAL; Future   Pap: not due Mammogram : ordered Labs: HIV, Hep c recommended, pt declines due to cost  Refills: none Orders: Tdap today Referral: none  Routine preventative health maintenance measures emphasized. Please refer to After Visit Summary for other counseling recommendations.      Doreene Burke, CNM [facility],  [facility]

## 2021-03-21 NOTE — Addendum Note (Signed)
Addended by: Lady Deutscher on: 03/21/2021 02:26 PM   Modules accepted: Orders

## 2021-05-21 IMAGING — MG DIGITAL DIAGNOSTIC BILATERAL MAMMOGRAM WITH TOMO AND CAD
8 of 12 series · 9 of 32 positions shown · non-contrast
Comparison: Previous exam(s).

CLINICAL DATA: Short-term interval follow-up of probable benign
calcifications in left breast.

EXAM:
DIGITAL DIAGNOSTIC BILATERAL MAMMOGRAM WITH CAD AND TOMO

[L ML]
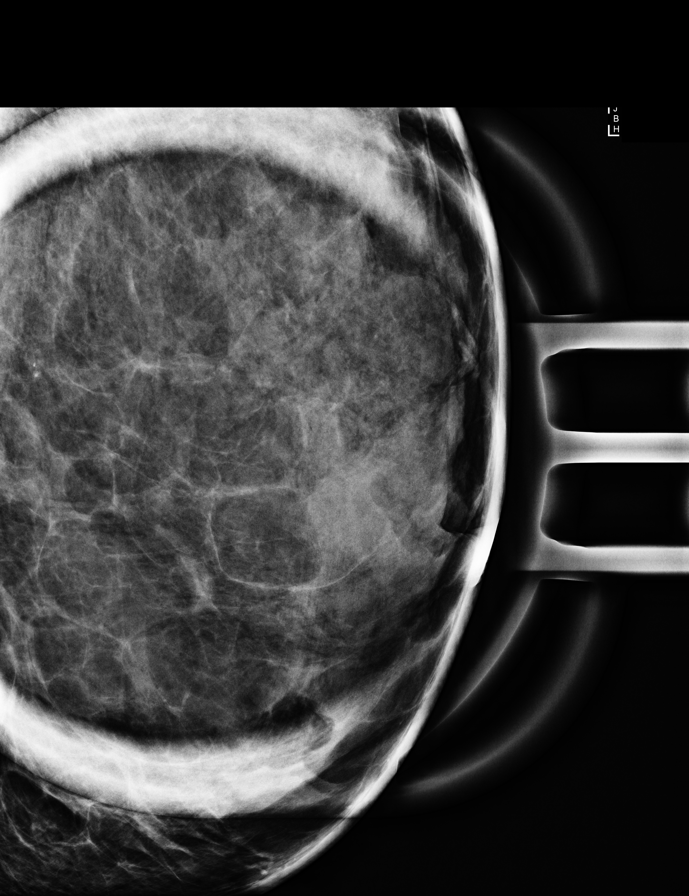

[L CC]
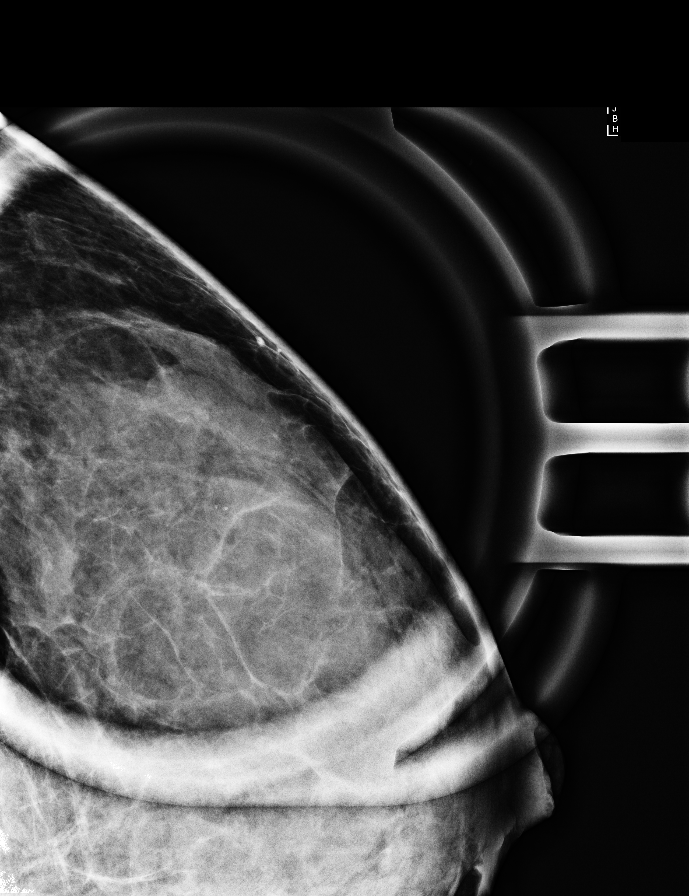

[R MLO synth-2D (1 of 2)]
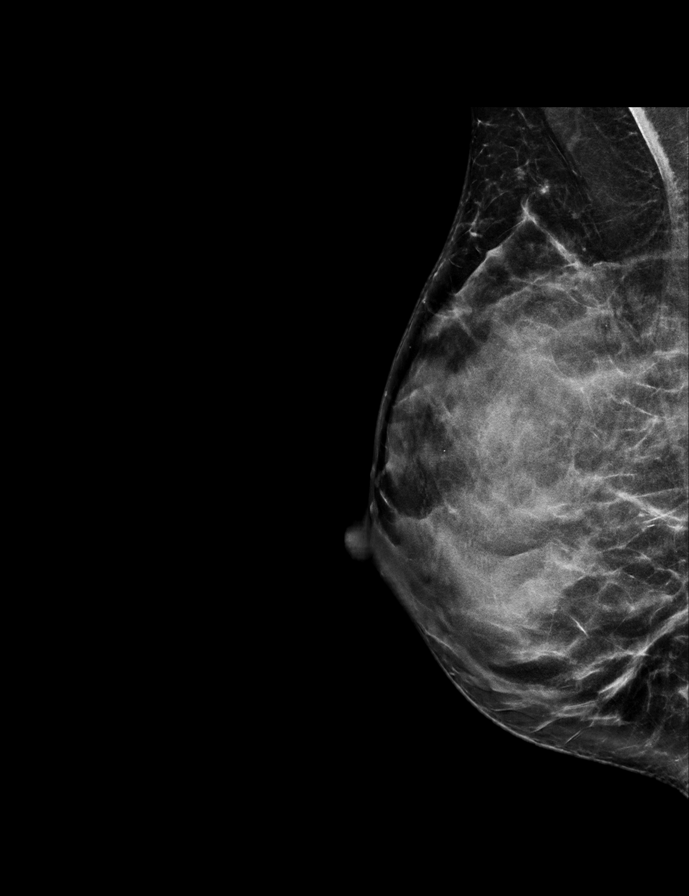

[L MLO synth-2D]
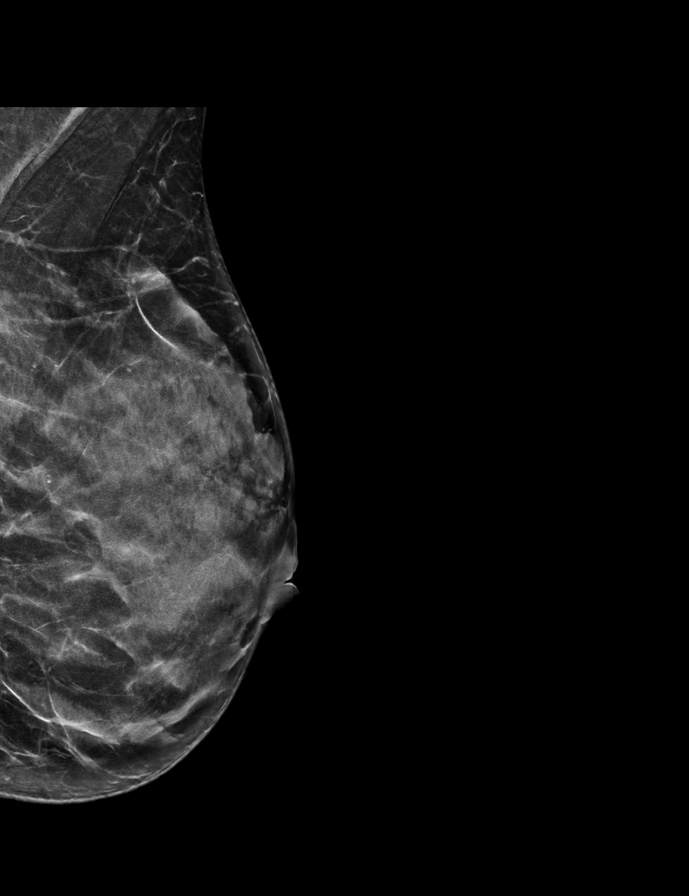

[R MLO synth-2D (2 of 2)]
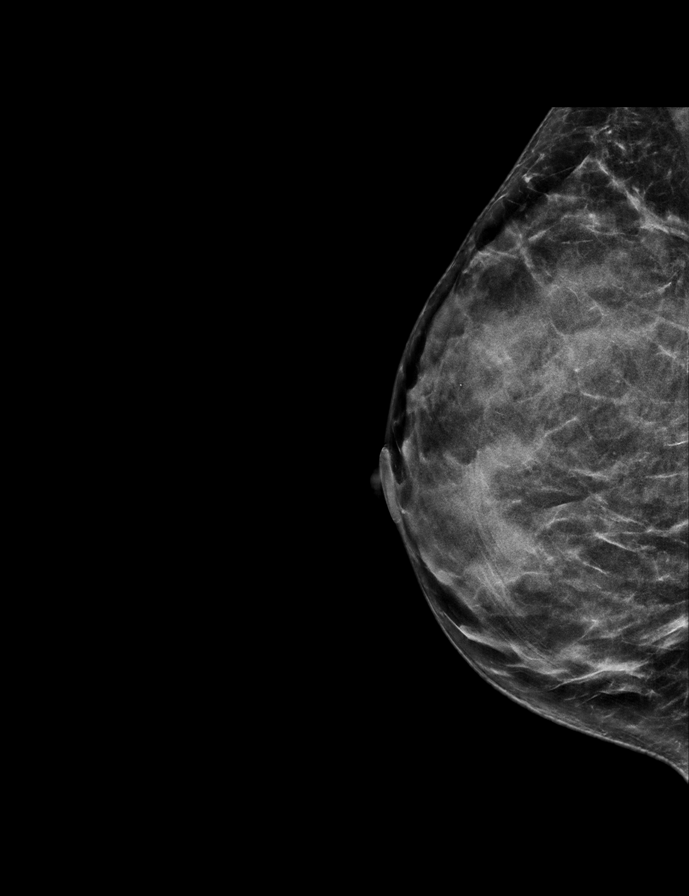

[L CC synth-2D]
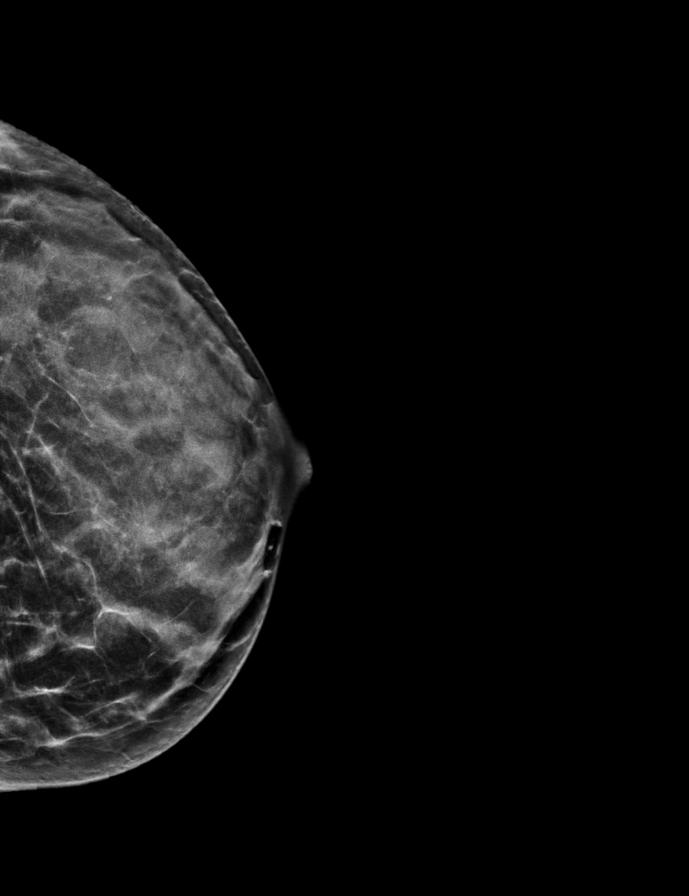

[R CC synth-2D]
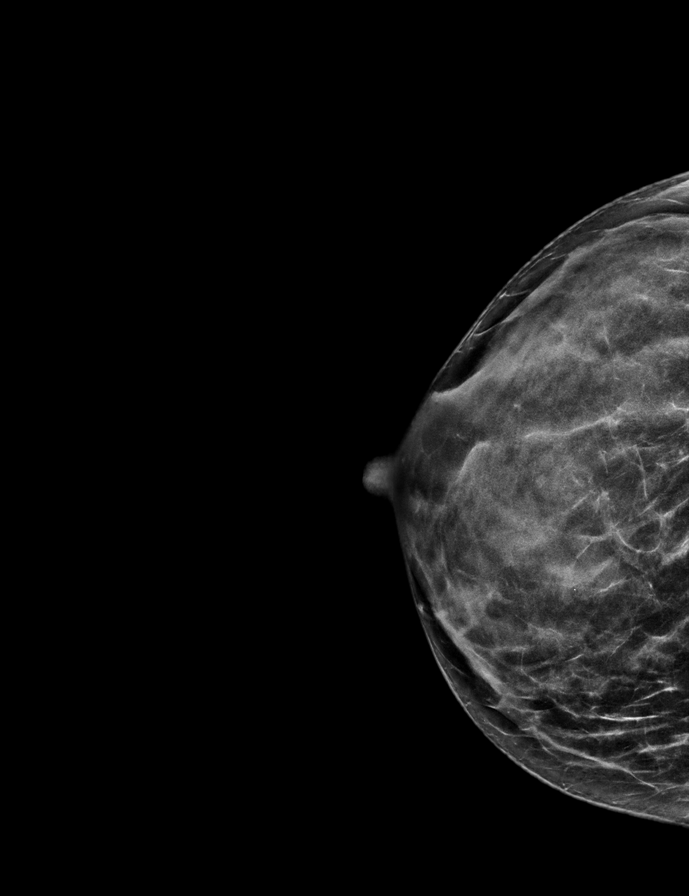

[R MLO tomo · 2 of 58 frames shown]
[frame 19/58]
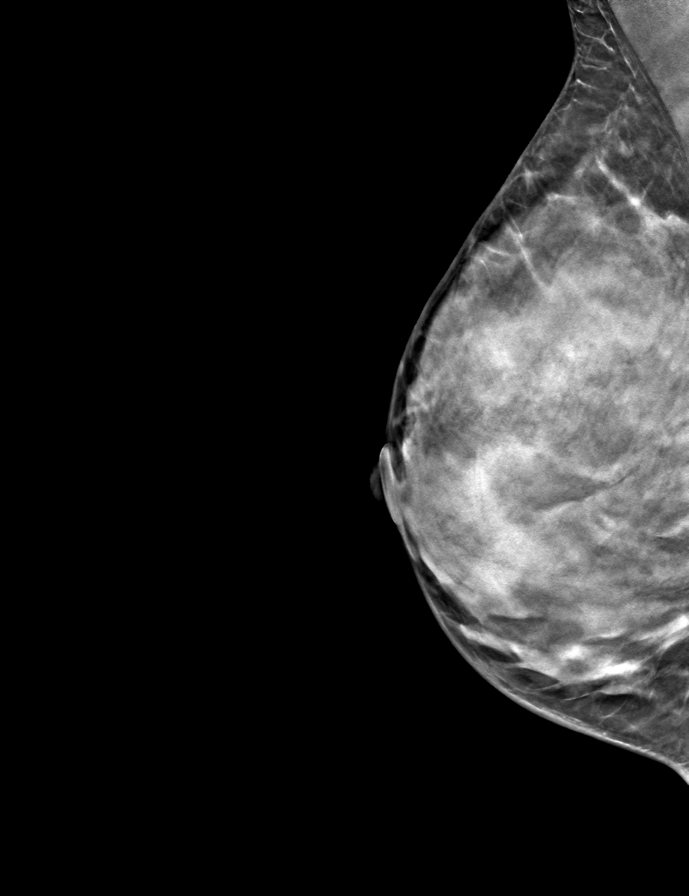
[frame 29/58]
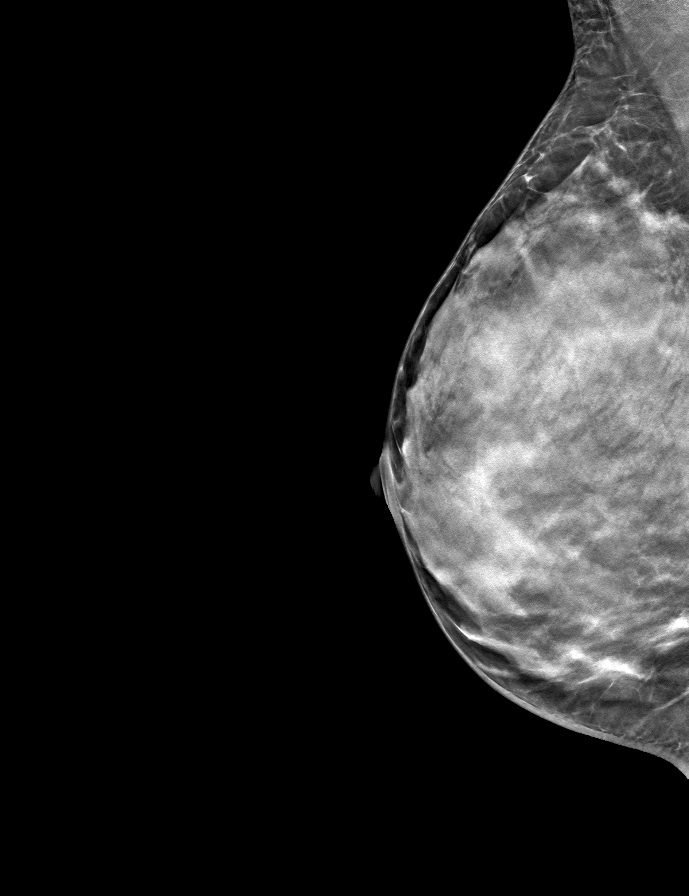

[9 of 32 positions shown; findings below may reference images not displayed]

ACR Breast Density Category d: The breast tissue is extremely dense,
which lowers the sensitivity of mammography.
FINDINGS: No suspicious mass or distortion identified in either breast. Two
coarse calcifications in the upper-outer quadrant of the left breast
are stable compared to the prior exam dated 04/30/2018. They are
felt to likely be benign.

Mammographic images were processed with CAD.
IMPRESSION: Stable benign appearing calcifications in the left breast.

RECOMMENDATION:
Bilateral diagnostic mammogram in 1 year is recommended to complete
a 2 year follow-up.

I have discussed the findings and recommendations with the patient.
Results were also provided in writing at the conclusion of the
visit. If applicable, a reminder letter will be sent to the patient
regarding the next appointment.

BI-RADS CATEGORY  3: Probably benign.

## 2022-03-26 ENCOUNTER — Encounter: Payer: 59 | Admitting: Certified Nurse Midwife

## 2023-01-08 ENCOUNTER — Encounter: Payer: Self-pay | Admitting: Obstetrics & Gynecology

## 2023-01-08 ENCOUNTER — Ambulatory Visit (INDEPENDENT_AMBULATORY_CARE_PROVIDER_SITE_OTHER): Payer: Managed Care, Other (non HMO) | Admitting: Obstetrics & Gynecology

## 2023-01-08 ENCOUNTER — Other Ambulatory Visit (HOSPITAL_COMMUNITY)
Admission: RE | Admit: 2023-01-08 | Discharge: 2023-01-08 | Disposition: A | Discharge status: Home / Self Care | Payer: 59 | Source: Ambulatory Visit | Attending: Obstetrics & Gynecology | Admitting: Obstetrics & Gynecology

## 2023-01-08 VITALS — BP 114/58 | HR 63 | Ht 68.0 in | Wt 160.0 lb

## 2023-01-08 DIAGNOSIS — Z01419 Encounter for gynecological examination (general) (routine) without abnormal findings: Secondary | ICD-10-CM | POA: Diagnosis not present

## 2023-01-08 DIAGNOSIS — Z0000 Encounter for general adult medical examination without abnormal findings: Secondary | ICD-10-CM | POA: Diagnosis present

## 2023-01-08 DIAGNOSIS — Z800 Family history of malignant neoplasm of digestive organs: Secondary | ICD-10-CM

## 2023-01-08 DIAGNOSIS — Z124 Encounter for screening for malignant neoplasm of cervix: Secondary | ICD-10-CM

## 2023-01-08 DIAGNOSIS — Z1231 Encounter for screening mammogram for malignant neoplasm of breast: Secondary | ICD-10-CM

## 2023-01-08 NOTE — Progress Notes (Signed)
GYNECOLOGY ANNUAL PHYSICAL EXAM PROGRESS NOTE  Subjective:    Victoria Leblanc is a 47 y.o. married G2P2002 f(15 and 18yo sons) who presents for an annual exam. The patient has no complaints today. She has monthly periods with her 47 year old Mirena IUD. She is happy with the IUD. The patient is sexually active. The patient participates in regular exercise: yes. Has the patient ever been transfused or tattooed?: yes. The patient reports that there is not domestic violence in her life.    Menstrual History:  Patient's last menstrual period was 12/12/2022. Period Cycle (Days): 28 Period Duration (Days): 3 Period Pattern: Regular Menstrual Flow: Heavy, Light, Moderate Dysmenorrhea: None   Gynecologic History:  Contraception: IUD History of STI's:   FH: + pancreas cancer in her maternal GM and GF Her mom died of cancer but she doesn't know which type       OB History  Gravida Para Term Preterm AB Living  0 0 2  SAB IAB Ectopic Multiple Live Births  0 0 0 0 2    # Outcome Date GA Lbr Len/2nd Weight Sex Delivery Anes PTL Lv  2 Term 2008   7 lb 1.9 oz (3.23 kg) F Vag-Spont   LIV  1 Term 2005   7 lb 2.4 oz (3.243 kg) M Vag-Spont   LIV    Past Medical History:  Diagnosis Date   Allergy    History of chicken pox     Past Surgical History:  Procedure Laterality Date   KNEE SURGERY Bilateral    x 6; Dr. 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    Comment: mirena  Other Topics Concern   Not on file  Social History Narrative   Not on file   Social Determinants of Health   Financial Resource Strain: Not on file  Food Insecurity: Not on file  Transportation Needs: Not on file  Physical Activity: Not on file  Stress: Not on file  Social Connections: Not on file  Intimate Partner Violence: Not on file    Current Outpatient Medications on File Prior to Visit  Medication Sig Dispense Refill   levonorgestrel (MIRENA) 20 MCG/24HR IUD 1 each by Intrauterine route once.     Norethindrone-Ethinyl Estradiol-Fe Biphas (LO LOESTRIN FE) 1 MG-10 MCG / 10 MCG tablet Take 1 tablet by mouth daily. (Patient not taking: Reported on 04/10/2018) 3 Package 3   scopolamine (TRANSDERM-SCOP, 1.5 MG,) 1 MG/3DAYS Place 1 patch (1.5  mg total) onto the skin every 3 (three) days. (Patient not taking: Reported on 04/10/2018) 10 patch 1   No current facility-administered medications on file prior to visit.    Allergies  Allergen Reactions   Morphine     swelling, hives.   Pollen Extract     itchy eyes.     Review of Systems Constitutional: negative for chills, fatigue, fevers and sweats Eyes: negative for irritation, redness and visual disturbance Ears, nose, mouth, throat, and face: negative for hearing loss, nasal congestion, snoring and tinnitus Respiratory: negative for asthma, cough, sputum Cardiovascular: negative for chest pain, dyspnea, exertional chest pressure/discomfort, irregular heart beat, palpitations and syncope Gastrointestinal: negative for abdominal pain, change in bowel habits, nausea and vomiting Genitourinary: negative for abnormal menstrual periods, genital lesions, sexual problems  and vaginal discharge, dysuria and urinary incontinence Integument/breast: negative for breast lump, breast tenderness and nipple discharge Hematologic/lymphatic: negative for bleeding and easy bruising Musculoskeletal:negative for back pain and muscle weakness Neurological: negative for dizziness, headaches, vertigo and weakness Endocrine: negative for diabetic symptoms including polydipsia, polyuria and skin dryness Allergic/Immunologic: negative for hay fever and urticaria      Objective:  Blood pressure (!) 114/58, pulse 63, height  (1.727 m), weight 160 lb (72.6 kg), last menstrual period 12/12/2022. Body mass index is 24.33 kg/m.    General Appearance:    Alert, cooperative, no distress, appears stated age  Head:    Normocephalic, without obvious abnormality, atraumatic  Eyes:    PERRL, conjunctiva/corneas clear, EOM's intact, both eyes  Ears:    Normal external ear canals, both ears  Nose:   Nares normal, septum midline, mucosa normal, no drainage or sinus tenderness  Throat:   Lips, mucosa, and tongue normal; teeth and gums normal  Neck:   Supple, symmetrical, trachea midline, no adenopathy; thyroid: no enlargement/tenderness/nodules; no carotid bruit or JVD  Back:     Symmetric, no curvature, ROM normal, no CVA tenderness  Lungs:     Clear to auscultation bilaterally, respirations unlabored  Chest Wall:    No tenderness or deformity   Heart:    Regular rate and rhythm, S1 and S2 normal, no murmur, rub or gallop  Breast Exam:    No tenderness, masses, or nipple abnormality  Abdomen:     Soft, non-tender, bowel sounds active all four quadrants, no masses, no organomegaly.    Genitalia:    Pelvic:external genitalia normal, vagina without lesions, discharge, or tenderness, rectovaginal septum  normal. Cervix normal in appearance, no cervical motion tenderness, no adnexal masses or tenderness.  Uterus normal size, shape, mobile, regular contours, nontender.  Rectal:    Normal  external sphincter.  No hemorrhoids appreciated. Internal exam not done.   Extremities:   Extremities normal, atraumatic, no cyanosis or edema  Pulses:   2+ and symmetric all extremities  Skin:   Skin color, texture, turgor normal, no rashes or lesions  Lymph nodes:   Cervical, supraclavicular, and axillary nodes normal  Neurologic:   CNII-XII intact, normal strength, sensation and reflexes throughout   .  Labs:  Lab Results  Component Value Date   WBC 4.6 06/01/2013   HGB 14.5 06/01/2013   HCT 44 06/01/2013   PLT 221 06/01/2013    Lab Results  Component Value Date   CREATININE 0.80 04/10/2018   BUN 13 04/10/2018   NA 141 04/10/2018   K 4.3 04/10/2018   CL 103 04/10/2018   CO2 25 04/10/2018    Lab Results  Component  Value Date   ALT 8 04/10/2018   AST 15 04/10/2018   ALKPHOS 28 (L) 04/10/2018   BILITOT 0.3 04/10/2018    Lab Results  Component Value Date   TSH 1.92 06/01/2013     Assessment:   1. Well woman exam with routine gynecological exam   2. Screening mammogram for breast cancer   3. Screening for cervical cancer   4. Preventative health care   5. Family history of malignant neoplasm of pancreas      Plan:  She will schedule fasting labs, has to get to her son's soccer game today Pap with with HPV cotesting Mammogram MyRisk Myriad testing when she gets her other blood drawn  Follow up in 1 year for annual exam   Allie Bossier, MD [facility]

## 2023-01-10 LAB — CYTOLOGY - PAP
Comment: NEGATIVE
Diagnosis: UNDETERMINED — AB
High risk HPV: NEGATIVE

## 2023-01-11 ENCOUNTER — Other Ambulatory Visit: Payer: 59

## 2023-01-12 LAB — COMPREHENSIVE METABOLIC PANEL
ALT: 6 IU/L (ref 0–32)
AST: 12 IU/L (ref 0–40)
Albumin/Globulin Ratio: 2.1 (ref 1.2–2.2)
Albumin: 4.1 g/dL (ref 3.9–4.9)
Alkaline Phosphatase: 35 IU/L — ABNORMAL LOW (ref 44–121)
BUN/Creatinine Ratio: 17 (ref 9–23)
BUN: 12 mg/dL (ref 6–24)
Bilirubin Total: 0.3 mg/dL (ref 0.0–1.2)
CO2: 24 mmol/L (ref 20–29)
Calcium: 8.7 mg/dL (ref 8.7–10.2)
Chloride: 105 mmol/L (ref 96–106)
Creatinine, Ser: 0.72 mg/dL (ref 0.57–1.00)
Globulin, Total: 2 g/dL (ref 1.5–4.5)
Glucose: 79 mg/dL (ref 70–99)
Potassium: 4.4 mmol/L (ref 3.5–5.2)
Sodium: 140 mmol/L (ref 134–144)
Total Protein: 6.1 g/dL (ref 6.0–8.5)
eGFR: 104 mL/min/{1.73_m2} (ref >59)

## 2023-01-12 LAB — CBC
Hematocrit: 39.5 % (ref 34.0–46.6)
Hemoglobin: 13.2 g/dL (ref 11.1–15.9)
MCH: 29.5 pg (ref 26.6–33.0)
MCHC: 33.4 g/dL (ref 31.5–35.7)
MCV: 88 fL (ref 79–97)
Platelets: 268 10*3/uL (ref 150–450)
RBC: 4.47 x10E6/uL (ref 3.77–5.28)
RDW: 12.5 % (ref 11.7–15.4)
WBC: 5.1 10*3/uL (ref 3.4–10.8)

## 2023-01-12 LAB — LIPID PANEL
Chol/HDL Ratio: 2.1 ratio (ref 0.0–4.4)
Cholesterol, Total: 137 mg/dL (ref 100–199)
HDL: 64 mg/dL (ref >39)
LDL Chol Calc (NIH): 64 mg/dL (ref 0–99)
Triglycerides: 34 mg/dL (ref 0–149)
VLDL Cholesterol Cal: 9 mg/dL (ref 5–40)

## 2023-01-12 LAB — TSH+FREE T4
Free T4: 1.11 ng/dL (ref 0.82–1.77)
TSH: 1.61 u[IU]/mL (ref 0.450–4.500)

## 2023-01-12 LAB — VITAMIN D 25 HYDROXY (VIT D DEFICIENCY, FRACTURES): Vit D, 25-Hydroxy: 25.5 ng/mL — ABNORMAL LOW (ref 30.0–100.0)

## 2023-01-14 ENCOUNTER — Encounter: Payer: Self-pay | Admitting: Obstetrics & Gynecology

## 2023-01-14 ENCOUNTER — Other Ambulatory Visit: Payer: Self-pay | Admitting: Obstetrics & Gynecology

## 2023-01-14 DIAGNOSIS — E559 Vitamin D deficiency, unspecified: Secondary | ICD-10-CM

## 2023-01-14 MED ORDER — VITAMIN D (ERGOCALCIFEROL) 1.25 MG (50000 UNIT) PO CAPS: 50000.0000 [IU] | ORAL_CAPSULE | ORAL | 0 refills | Status: AC

## 2023-01-14 NOTE — Progress Notes (Signed)
Vitamin d prescribed for deficiency Patient notified via Mychart. 

## 2023-02-04 ENCOUNTER — Other Ambulatory Visit: Payer: Self-pay | Admitting: Obstetrics & Gynecology

## 2023-03-19 ENCOUNTER — Other Ambulatory Visit: Payer: Self-pay | Admitting: Obstetrics & Gynecology

## 2023-03-19 DIAGNOSIS — R921 Mammographic calcification found on diagnostic imaging of breast: Secondary | ICD-10-CM

## 2023-03-27 ENCOUNTER — Other Ambulatory Visit: Payer: Self-pay | Admitting: Obstetrics and Gynecology

## 2023-03-27 DIAGNOSIS — R921 Mammographic calcification found on diagnostic imaging of breast: Secondary | ICD-10-CM

## 2023-04-18 ENCOUNTER — Ambulatory Visit
Admission: RE | Admit: 2023-04-18 | Discharge: 2023-04-18 | Disposition: A | Discharge status: Home / Self Care | Payer: Managed Care, Other (non HMO) | Source: Ambulatory Visit | Attending: Obstetrics and Gynecology | Admitting: Obstetrics and Gynecology

## 2023-04-18 DIAGNOSIS — R921 Mammographic calcification found on diagnostic imaging of breast: Secondary | ICD-10-CM | POA: Insufficient documentation

## 2024-08-25 ENCOUNTER — Other Ambulatory Visit: Payer: Self-pay | Admitting: Obstetrics and Gynecology

## 2024-08-25 DIAGNOSIS — Z1231 Encounter for screening mammogram for malignant neoplasm of breast: Secondary | ICD-10-CM

## 2024-08-31 ENCOUNTER — Inpatient Hospital Stay: Admission: RE | Admit: 2024-08-31 | Discharge: 2024-08-31 | Attending: Obstetrics and Gynecology

## 2024-08-31 DIAGNOSIS — Z1231 Encounter for screening mammogram for malignant neoplasm of breast: Secondary | ICD-10-CM

## 2024-09-07 ENCOUNTER — Ambulatory Visit: Payer: Self-pay | Admitting: Obstetrics and Gynecology
